# Patient Record
Sex: Female | Born: 1965
Health system: Southern US, Community
[De-identification: ages and names within clinical notes are randomized; demographics above are authoritative.]

## PROBLEM LIST (undated history)

## (undated) HISTORY — PX: ECTOPIC PREGNANCY SURGERY: SHX613

---

## 1999-02-28 ENCOUNTER — Ambulatory Visit (HOSPITAL_COMMUNITY): Admission: RE | Admit: 1999-02-28 | Discharge: 1999-02-28 | Payer: Self-pay | Admitting: Gastroenterology

## 2006-05-21 ENCOUNTER — Other Ambulatory Visit: Admission: RE | Admit: 2006-05-21 | Discharge: 2006-05-21 | Payer: Self-pay | Admitting: Gynecology

## 2006-07-04 ENCOUNTER — Ambulatory Visit (HOSPITAL_COMMUNITY): Admission: RE | Admit: 2006-07-04 | Discharge: 2006-07-04 | Payer: Self-pay | Admitting: Gynecology

## 2006-09-19 ENCOUNTER — Encounter: Admission: RE | Admit: 2006-09-19 | Discharge: 2006-09-19 | Payer: Self-pay | Admitting: Gynecology

## 2010-03-04 ENCOUNTER — Encounter: Payer: Self-pay | Admitting: Gynecology

## 2013-03-27 ENCOUNTER — Encounter (HOSPITAL_BASED_OUTPATIENT_CLINIC_OR_DEPARTMENT_OTHER): Payer: Self-pay | Admitting: Emergency Medicine

## 2013-03-27 ENCOUNTER — Emergency Department (HOSPITAL_BASED_OUTPATIENT_CLINIC_OR_DEPARTMENT_OTHER)
Admission: EM | Admit: 2013-03-27 | Discharge: 2013-03-27 | Disposition: A | Discharge status: Home / Self Care | Payer: Self-pay | Attending: Emergency Medicine | Admitting: Emergency Medicine

## 2013-03-27 ENCOUNTER — Emergency Department (HOSPITAL_BASED_OUTPATIENT_CLINIC_OR_DEPARTMENT_OTHER): Payer: Self-pay

## 2013-03-27 DIAGNOSIS — M6530 Trigger finger, unspecified finger: Secondary | ICD-10-CM | POA: Insufficient documentation

## 2013-03-27 MED ORDER — NAPROXEN 500 MG PO TABS: 500.0000 mg | ORAL_TABLET | Freq: Two times a day (BID) | ORAL | Status: DC

## 2013-03-27 NOTE — ED Provider Notes (Signed)
CSN: [id number]     Arrival date & time 03/27/13  1228 History   First MD Initiated Contact with Patient 03/27/13 1240     Chief Complaint  Patient presents with  . Hand Pain     (Consider location/radiation/quality/duration/timing/severity/associated sxs/prior Treatment) Patient is a 48 y.o. female presenting with hand pain. The history is provided by the patient.  Hand Pain This is a new problem. The problem occurs constantly. The problem has been gradually worsening.   Shannon Mccormick is a 48 y.o. female who presents to the ED with right thumb pain. She has had the pain for several days. She states that she woke one morning and felt a pop in her thumb and it has been giving her problems since then. She denies any other problems.   History reviewed. No pertinent past medical history. History reviewed. No pertinent past surgical history. No family history on file. History  Substance Use Topics  . Smoking status: Never Smoker   . Smokeless tobacco: Not on file  . Alcohol Use: Yes   OB History   Grav Para Term Preterm Abortions TAB SAB Ect Mult Living                 Review of Systems Negative except as stated in HPI   Allergies  Codeine  Home Medications  No current outpatient prescriptions on file. BP 133/79  Pulse 98  Temp(Src) 98 F (36.7 C) (Oral)  Resp 18  Ht 5\' 7"  (1.702 m)  Wt 158 lb (71.668 kg)  BMI 24.74 kg/m2  SpO2 100% Physical Exam  Nursing note and vitals reviewed. Constitutional: She is oriented to person, place, and time. She appears well-developed and well-nourished.  HENT:  Head: Normocephalic.  Eyes: EOM are normal.  Neck: Neck supple.  Cardiovascular: Normal rate.   Pulmonary/Chest: Effort normal.  Musculoskeletal:       Right hand: She exhibits normal range of motion, normal capillary refill, no deformity, no laceration and no swelling. Tenderness: minimal. Normal sensation noted. Normal strength noted.       Hands: Radial pulses equal,  adequate circulation, good touch sensation.   Neurological: She is alert and oriented to person, place, and time. No cranial nerve deficit.  Skin: Skin is warm and dry.  Psychiatric: She has a normal mood and affect.    ED Course  Procedures   MDM  48 y.o. female with right thumb popping. Will treat for trigger finger. Placed in splint and she will follow up with ortho. She will return here as needed for problems.  Discussed with the patient and all questioned fully answered.   Medication List         naproxen 500 MG tablet  Commonly known as:  NAPROSYN  Take 1 tablet (500 mg total) by mouth 2 (two) times daily.            [address]Hope [place], [place] 03/29/13 [phone]

## 2013-03-27 NOTE — ED Notes (Signed)
Patient c/o R thumb pain, moves in jerking motion with associated pain, taking ibuprofen but no relief

## 2013-03-27 NOTE — Discharge Instructions (Signed)
Happy Valentines day. We are applying a splint to your thumb to try and help with the pain. We are prescribing medication for pain and inflammation. Follow up with Dr. Pearletha ForgeHudnall. Return here as needed for problems.   Trigger Finger Trigger finger (digital tendinitis and stenosing tenosynovitis) is a common disorder that causes an often painful catching of the fingers or thumb. It occurs as a clicking, snapping, or locking of a finger in the palm of the hand. This is caused by a problem with the tendons that flex or bend the fingers sliding smoothly through their sheaths. The condition may occur in any finger or a couple fingers at the same time.  The finger may lock with the finger curled or suddenly straighten out with a snap. This is more common in patients with rheumatoid arthritis and diabetes. Left untreated, the condition may get worse to the point where the finger becomes locked in flexion, like making a fist, or less commonly locked with the finger straightened out. CAUSES   Inflammation and scarring that lead to swelling around the tendon sheath.  Repeated or forceful movements.  Rheumatoid arthritis, an autoimmune disease that affects joints.  Gout.  Diabetes mellitus. SIGNS AND SYMPTOMS  Soreness and swelling of your finger.  A painful clicking or snapping as you bend and straighten your finger. DIAGNOSIS  Your health care provider will do a physical exam of your finger to diagnose trigger finger. TREATMENT   Splinting for 6 8 weeks may be helpful.  Nonsteroidal anti-inflammatory medicines (NSAIDs) can help to relieve the pain and inflammation.  Cortisone injections, along with splinting, may speed up recovery. Several injections may be required. Cortisone may give relief after one injection.  Surgery is another treatment that may be used if conservative treatments do not work. Surgery can be minor, without incisions (a cut does not have to be made), and can be done with a  needle through the skin.  Other surgical choices involve an open procedure in which the surgeon opens the hand through a small incision and cuts the pulley so the tendon can again slide smoothly. Your hand will still work fine. HOME CARE INSTRUCTIONS  Apply ice to the injured area, twice per day:  Put ice in a plastic bag.  Place a towel between your skin and the bag.  Leave the ice on for 20 minutes, 3 4 times a day.  Rest your hand often. MAKE SURE YOU:   Understand these instructions.  Will watch your condition.  Will get help right away if you are not doing well or get worse. Document Released: 11/18/2003 Document Revised: 09/30/2012 Document Reviewed: 06/30/2012 [facility] Patient Information 2014 [place], [place].

## 2013-03-29 NOTE — ED Provider Notes (Signed)
Medical screening examination/treatment/procedure(s) were performed by non-physician practitioner and as supervising physician I was immediately available for consultation/collaboration.  EKG Interpretation   None        Ethelda ChickMartha K Linker, MD 03/29/13 [phone]

## 2014-07-30 ENCOUNTER — Encounter (HOSPITAL_BASED_OUTPATIENT_CLINIC_OR_DEPARTMENT_OTHER): Payer: Self-pay | Admitting: *Deleted

## 2014-07-30 ENCOUNTER — Emergency Department (HOSPITAL_BASED_OUTPATIENT_CLINIC_OR_DEPARTMENT_OTHER)
Admission: EM | Admit: 2014-07-30 | Discharge: 2014-07-30 | Disposition: A | Discharge status: Home / Self Care | Payer: Self-pay | Attending: Emergency Medicine | Admitting: Emergency Medicine

## 2014-07-30 DIAGNOSIS — Z791 Long term (current) use of non-steroidal anti-inflammatories (NSAID): Secondary | ICD-10-CM | POA: Insufficient documentation

## 2014-07-30 DIAGNOSIS — L237 Allergic contact dermatitis due to plants, except food: Secondary | ICD-10-CM | POA: Insufficient documentation

## 2014-07-30 MED ORDER — PREDNISONE 10 MG PO TABS: ORAL_TABLET | ORAL | Status: DC

## 2014-07-30 NOTE — Discharge Instructions (Signed)
Poison Ivy  Poison ivy is a rash caused by touching the leaves of the poison ivy plant. The rash often shows up 48 hours later. You might just have bumps, redness, and itching. Sometimes, blisters appear and break open. Your eyes may get puffy (swollen). Poison ivy often heals in 2 to 3 weeks without treatment.  HOME CARE  · If you touch poison ivy:  ¨ Wash your skin with soap and water right away. Wash under your fingernails. Do not rub the skin very hard.  ¨ Wash any clothes you were wearing.  · Avoid poison ivy in the future. Poison ivy has 3 leaves on a stem.  · Use medicine to help with itching as told by your doctor. Do not drive when you take this medicine.  · Keep open sores dry, clean, and covered with a bandage and medicated cream, if needed.  · Ask your doctor about medicine for children.  GET HELP RIGHT AWAY IF:  · You have open sores.  · Redness spreads beyond the area of the rash.  · There is yellowish white fluid (pus) coming from the rash.  · Pain gets worse.  · You have a temperature by mouth above 102° F (38.9° C), not controlled by medicine.  MAKE SURE YOU:  · Understand these instructions.  · Will watch your condition.  · Will get help right away if you are not doing well or get worse.  Document Released: 03/02/2010 Document Revised: 04/22/2011 Document Reviewed: 03/02/2010  ExitCare® Patient Information ©2015 [employer] LLC. This information is not intended to replace advice given to you by your health care provider. Make sure you discuss any questions you have with your health care provider.

## 2014-07-30 NOTE — ED Notes (Signed)
Pt reports rash to legs and arms since cutting grass on Sunday- itchy-

## 2014-07-30 NOTE — ED Provider Notes (Signed)
CSN: [id number]     Arrival date & time 07/30/14  1708 History   First MD Initiated Contact with Patient 07/30/14 1725     Chief Complaint  Patient presents with  . Rash     (Consider location/radiation/quality/duration/timing/severity/associated sxs/prior Treatment) HPI Comments: Pt comes in with rash to legs and arms that started about 1 week ago. Pt states that the rash is very itchy. No fever or tick exposure. She has not tried any thing for the symptoms. Pt states that she was mowing the grass the day before this happened. Denies problems swallowing or breathing  The history is provided by the patient. No language interpreter was used.    History reviewed. No pertinent past medical history. Past Surgical History  Procedure Laterality Date  . Cesarean section    . Ectopic pregnancy surgery     No family history on file. History  Substance Use Topics  . Smoking status: Never Smoker   . Smokeless tobacco: Never Used  . Alcohol Use: Yes   OB History    No data available     Review of Systems  All other systems reviewed and are negative.     Allergies  Codeine  Home Medications   Prior to Admission medications   Medication Sig Start Date End Date Taking? Authorizing Provider  naproxen (NAPROSYN) 500 MG tablet Take 1 tablet (500 mg total) by mouth 2 (two) times daily. 03/27/13   Hope Orlene Och, NP  predniSONE (DELTASONE) 10 MG tablet 6 day step down dose 07/30/14   Teressa Lower, NP   BP 119/69 mmHg  Pulse 89  Temp(Src) 98.4 F (36.9 C) (Oral)  Resp 18  Ht 5\' 7"  (1.702 m)  SpO2 100% Physical Exam  Constitutional: She is oriented to person, place, and time. She appears well-developed and well-nourished.  HENT:  Head: Normocephalic and atraumatic.  Right Ear: External ear normal.  Left Ear: External ear normal.  Mouth/Throat: Oropharynx is clear and moist.  Cardiovascular: Normal rate and regular rhythm.   Pulmonary/Chest: Effort normal and breath sounds  normal.  Musculoskeletal: Normal range of motion.  Neurological: She is alert and oriented to person, place, and time.  Skin:  Linear vesicular rash to all extremities noted  Nursing note and vitals reviewed.   ED Course  Procedures (including critical care time) Labs Review Labs Reviewed - No data to display  Imaging Review No results found.   EKG Interpretation None      MDM   Final diagnoses:  Poison ivy    Rash consistent with poison ivy. Will do a prednisone dose back.   Teressa Lower, NP 07/30/14 1740  Glynn Octave, MD 07/30/14 Windy Fast

## 2015-05-21 IMAGING — CR DG FINGER THUMB 2+V*R*
3 series · 3 of 3 positions shown · non-contrast
Comparison: None.

CLINICAL DATA: Right thumb pain for 3 months

EXAM:
RIGHT THUMB 2+V

[x finger pa right]
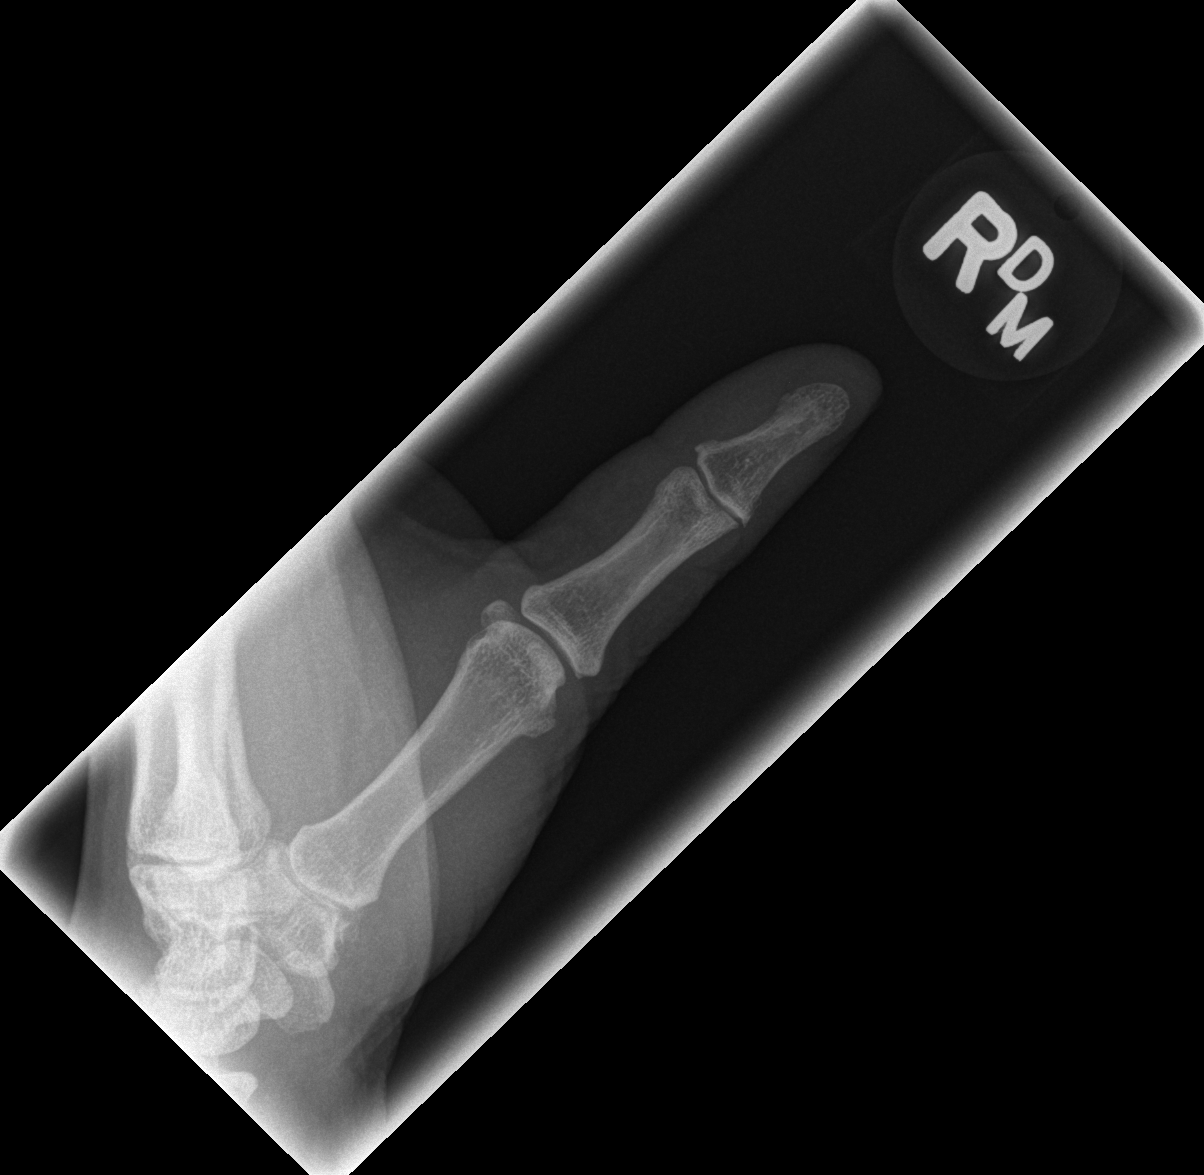

[x finger obl. right]
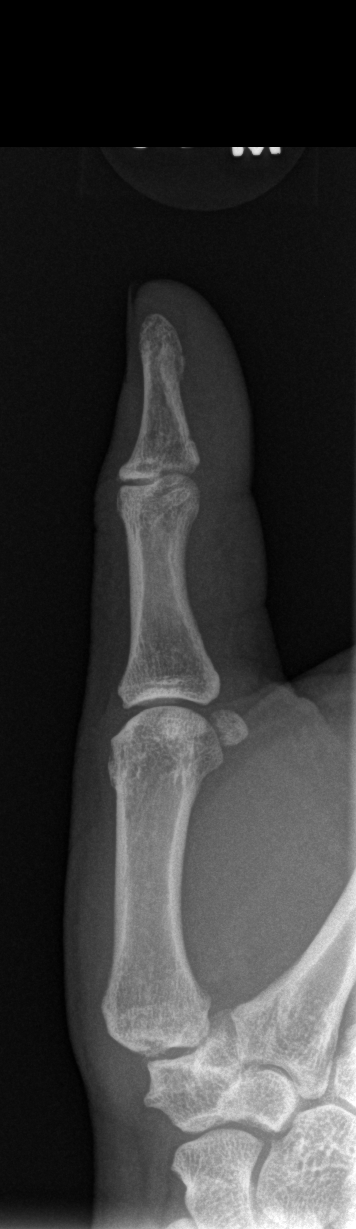

[x finger lateral right]
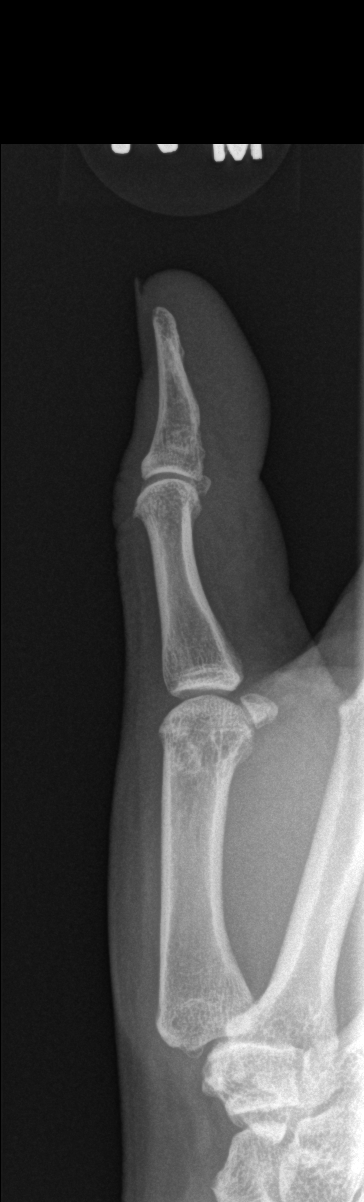

[3 of 3 positions shown; findings below may reference images not displayed]

FINDINGS: There is no evidence of fracture or dislocation. There is no
evidence of arthropathy or other focal bone abnormality. Soft
tissues are unremarkable. Minimal interphalangeal joint degenerative
change with spurring noted.
IMPRESSION: Negative.

## 2016-04-30 ENCOUNTER — Emergency Department (HOSPITAL_BASED_OUTPATIENT_CLINIC_OR_DEPARTMENT_OTHER)
Admission: EM | Admit: 2016-04-30 | Discharge: 2016-04-30 | Disposition: A | Discharge status: Home / Self Care | Payer: BLUE CROSS/BLUE SHIELD | Attending: Emergency Medicine | Admitting: Emergency Medicine

## 2016-04-30 ENCOUNTER — Encounter (HOSPITAL_BASED_OUTPATIENT_CLINIC_OR_DEPARTMENT_OTHER): Payer: Self-pay

## 2016-04-30 ENCOUNTER — Emergency Department (HOSPITAL_BASED_OUTPATIENT_CLINIC_OR_DEPARTMENT_OTHER): Payer: BLUE CROSS/BLUE SHIELD

## 2016-04-30 DIAGNOSIS — R0602 Shortness of breath: Secondary | ICD-10-CM | POA: Diagnosis not present

## 2016-04-30 DIAGNOSIS — R0789 Other chest pain: Secondary | ICD-10-CM | POA: Insufficient documentation

## 2016-04-30 DIAGNOSIS — R079 Chest pain, unspecified: Secondary | ICD-10-CM | POA: Diagnosis present

## 2016-04-30 DIAGNOSIS — Z87891 Personal history of nicotine dependence: Secondary | ICD-10-CM | POA: Diagnosis not present

## 2016-04-30 DIAGNOSIS — R1012 Left upper quadrant pain: Secondary | ICD-10-CM | POA: Insufficient documentation

## 2016-04-30 LAB — CBC WITH DIFFERENTIAL/PLATELET
BASOS ABS: 0 10*3/uL (ref 0.0–0.1)
BASOS PCT: 0 %
EOS ABS: 0.3 10*3/uL (ref 0.0–0.7)
Eosinophils Relative: 4 %
HEMATOCRIT: 39.3 % (ref 36.0–46.0)
Hemoglobin: 13.7 g/dL (ref 12.0–15.0)
Lymphocytes Relative: 50 %
Lymphs Abs: 3.5 10*3/uL (ref 0.7–4.0)
MCH: 29 pg (ref 26.0–34.0)
MCHC: 34.9 g/dL (ref 30.0–36.0)
MCV: 83.3 fL (ref 78.0–100.0)
MONO ABS: 0.7 10*3/uL (ref 0.1–1.0)
Monocytes Relative: 10 %
NEUTROS ABS: 2.6 10*3/uL (ref 1.7–7.7)
Neutrophils Relative %: 36 %
Platelets: 273 10*3/uL (ref 150–400)
RBC: 4.72 MIL/uL (ref 3.87–5.11)
RDW: 13.3 % (ref 11.5–15.5)
WBC: 7 10*3/uL (ref 4.0–10.5)

## 2016-04-30 LAB — TROPONIN I: Troponin I: <0.03 ng/mL (ref <0.03)

## 2016-04-30 LAB — COMPREHENSIVE METABOLIC PANEL
ALBUMIN: 4.6 g/dL (ref 3.5–5.0)
ALT: 21 U/L (ref 14–54)
AST: 23 U/L (ref 15–41)
Alkaline Phosphatase: 66 U/L (ref 38–126)
Anion gap: 7 (ref 5–15)
BILIRUBIN TOTAL: 0.7 mg/dL (ref 0.3–1.2)
BUN: 13 mg/dL (ref 6–20)
CHLORIDE: 104 mmol/L (ref 101–111)
CO2: 27 mmol/L (ref 22–32)
Calcium: 9.4 mg/dL (ref 8.9–10.3)
Creatinine, Ser: 1.15 mg/dL — ABNORMAL HIGH (ref 0.44–1.00)
GFR calc Af Amer: >60 mL/min (ref >60)
GFR calc non Af Amer: 53 mL/min — ABNORMAL LOW (ref >60)
GLUCOSE: 103 mg/dL — AB (ref 65–99)
POTASSIUM: 3.7 mmol/L (ref 3.5–5.1)
SODIUM: 138 mmol/L (ref 135–145)
TOTAL PROTEIN: 7.9 g/dL (ref 6.5–8.1)

## 2016-04-30 LAB — LIPASE, BLOOD: Lipase: 29 U/L (ref 11–51)

## 2016-04-30 MED ORDER — NAPROXEN 500 MG PO TABS: 500.0000 mg | ORAL_TABLET | Freq: Three times a day (TID) | ORAL | 0 refills | Status: DC | PRN

## 2016-04-30 MED ORDER — IBUPROFEN 400 MG PO TABS
600.0000 mg | ORAL_TABLET | Freq: Once | ORAL | Status: AC
  Administered 2016-04-30: 600 mg via ORAL
  Filled 2016-04-30: qty 1

## 2016-04-30 NOTE — ED Triage Notes (Signed)
c/o LUQ pain x "months"-NAD-steady gait

## 2016-04-30 NOTE — ED Provider Notes (Signed)
[facility] Provider Note   CSN: [id number] Arrival date & time: 04/30/16  1728   By signing my name below, I, Clarisse Gouge, attest that this documentation has been prepared under the direction and in the presence of Lavera Guise, MD. Electronically signed, Clarisse Gouge, ED Scribe. 04/30/16. 6:48 PM.   History   Chief Complaint Chief Complaint  Patient presents with  . Abdominal Pain   The history is provided by the patient and medical records. No language interpreter was used.    HPI Comments: Shannon Mccormick is a 51 y.o. female who presents to the Emergency Department complaining of pain under the left breast 6-8 months. She notes "come and go", pressure-like, "knot"-like, cramping pain that radiates downward, and she states it is exacerbated and constant with sitting down. She adds her pain is improved with walking around.She notes no attempted treatments at home. Pt denies N/V/D, bloody or dark stools, dysuria, fever, frequency or urgency. No dyspnea, palpation, syncope or near syncope. Only has pain when sitting down or with movement.   History reviewed. No pertinent past medical history.  There are no active problems to display for this patient.   Past Surgical History:  Procedure Laterality Date  . CESAREAN SECTION    . ECTOPIC PREGNANCY SURGERY      OB History    No data available       Home Medications    Prior to Admission medications   Medication Sig Start Date End Date Taking? Authorizing Provider  naproxen (NAPROSYN) 500 MG tablet Take 1 tablet (500 mg total) by mouth 3 (three) times daily with meals as needed for mild pain or moderate pain. 04/30/16   Lavera Guise, MD    Family History No family history on file.  Social History Social History  Substance Use Topics  . Smoking status: Former Games developer  . Smokeless tobacco: Never Used  . Alcohol use Yes     Comment: occ     Allergies   Codeine   Review of Systems Review of Systems    Constitutional: Negative for fever.  HENT: Negative for congestion.   Respiratory: Positive for shortness of breath.   Cardiovascular: Negative for leg swelling.  Gastrointestinal: Negative for blood in stool, diarrhea, nausea and vomiting.  Genitourinary: Negative for dysuria, frequency, hematuria and urgency.  Musculoskeletal: Positive for arthralgias and myalgias. Negative for joint swelling.  Allergic/Immunologic: Negative for immunocompromised state.  Neurological: Negative for syncope.  Hematological: Does not bruise/bleed easily.  Psychiatric/Behavioral: Negative for confusion.  All other systems reviewed and are negative.    Physical Exam Updated Vital Signs BP 131/87 (BP Location: Right Arm)   Pulse 73   Temp 98.3 F (36.8 C) (Oral)   Resp 17   SpO2 100%   Physical Exam Physical Exam  Nursing note and vitals reviewed. Constitutional: Well developed, well nourished, non-toxic, and in no acute distress Head: Normocephalic and atraumatic.  Mouth/Throat: Oropharynx is clear and moist.  Neck: Normal range of motion. Neck supple.  Cardiovascular: Normal rate and regular rhythm.   Pulmonary/Chest: Effort normal and breath sounds normal. Reproducible TTP over left 12th rib. Abdominal: Soft. There is no tenderness. There is no rebound and no guarding.  Musculoskeletal: Normal range of motion.  Neurological: Alert, no facial droop, fluent speech, moves all extremities symmetrically Skin: Skin is warm and dry.  Psychiatric: Cooperative   ED Treatments / Results  DIAGNOSTIC STUDIES: Oxygen Saturation is 100% on room air, normal by my interpretation.  COORDINATION OF CARE: 6:45 PM Discussed treatment plan with pt at bedside and pt agreed to plan. Will order blood work, medications and imaging.  Labs (all labs ordered are listed, but only abnormal results are displayed) Labs Reviewed  COMPREHENSIVE METABOLIC PANEL - Abnormal; Notable for the following:       Result  Value   Glucose, Bld 103 (*)    Creatinine, Ser 1.15 (*)    GFR calc non Af Amer 53 (*)    All other components within normal limits  CBC WITH DIFFERENTIAL/PLATELET  LIPASE, BLOOD  TROPONIN I    EKG  EKG Interpretation  Date/Time:  Tuesday April 30 2016 19:32:02 EDT Ventricular Rate:  75 PR Interval:    QRS Duration: 70 QT Interval:  405 QTC Calculation: 453 R Axis:   37 Text Interpretation:  Sinus rhythm Consider left atrial enlargement  normal early repol pattern no prior EKG  Confirmed by Kaylob Wallen MD, [place] Hippert [phone]) on 04/30/2016 8:00:46 PM       Radiology Dg Chest 2 View  Result Date: 04/30/2016 CLINICAL DATA:  Left lower anterior chest pain EXAM: CHEST  2 VIEW COMPARISON:  None. FINDINGS: The heart size and mediastinal contours are within normal limits. Both lungs are clear. The visualized skeletal structures are unremarkable. IMPRESSION: No active cardiopulmonary disease. Electronically Signed   By: Jasmine PangKim  Fujinaga M.D.   On: 04/30/2016 19:08    Procedures Procedures (including critical care time)  Medications Ordered in ED Medications  ibuprofen (ADVIL,MOTRIN) tablet 600 mg (600 mg Oral Given 04/30/16 1927)     Initial Impression / Assessment and Plan / ED Course  I have reviewed the triage vital signs and the nursing notes.  Pertinent labs & imaging results that were available during my care of the patient were reviewed by me and considered in my medical decision making (see chart for details).     51 year old female who presents with left upper quadrant abdominal pain. However on exam, she is not tender with palpation of her abdomen, and pain is reproducible with palpation of the left lower 11th and 12th ribs. It seems like musculoskeletal chest pain rather than true abdominal pain. Also he only feels this pain with sitting down and movement, suggestive of musculoskeletal pain. 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 Low  suspicion for ACS or other serious intrathoracic or cardiopulmonary cause of symptoms. EKG is nonischemic. Chest x-ray visualized and does not show acute cardiopulmonary processes. Blood work is all reassuring. I discussed supportive care as well as over-the-counter analgesics.  Final Clinical Impressions(s) / ED Diagnoses   Final diagnoses:  Chest wall pain    New Prescriptions New Prescriptions   NAPROXEN (NAPROSYN) 500 MG TABLET    Take 1 tablet (500 mg total) by mouth 3 (three) times daily with meals as needed for mild pain or moderate pain.   I personally performed the services described in this documentation, which was scribed in my presence. The recorded information has been reviewed and is accurate.    Lavera Guiseana Duo Buddy Loeffelholz, MD 04/30/16 2013

## 2016-04-30 NOTE — Discharge Instructions (Signed)
Your heart work-up and blood work is reassuring. Your chest x-ray also looks normal.  Take naprosyn for pain. Use ice or heat as adjunct. Avoid heavy lifting or strenuous activity at work.  Return for worsening symptoms, including fever, worsening pain, difficulty breathing, passing out or any there symptoms concerning to you.

## 2017-07-31 ENCOUNTER — Emergency Department (HOSPITAL_BASED_OUTPATIENT_CLINIC_OR_DEPARTMENT_OTHER): Payer: No Typology Code available for payment source

## 2017-07-31 ENCOUNTER — Other Ambulatory Visit: Payer: Self-pay

## 2017-07-31 ENCOUNTER — Encounter (HOSPITAL_BASED_OUTPATIENT_CLINIC_OR_DEPARTMENT_OTHER): Payer: Self-pay | Admitting: Emergency Medicine

## 2017-07-31 ENCOUNTER — Emergency Department (HOSPITAL_BASED_OUTPATIENT_CLINIC_OR_DEPARTMENT_OTHER)
Admission: EM | Admit: 2017-07-31 | Discharge: 2017-07-31 | Disposition: A | Discharge status: Home / Self Care | Payer: No Typology Code available for payment source | Attending: Emergency Medicine | Admitting: Emergency Medicine

## 2017-07-31 DIAGNOSIS — Y990 Civilian activity done for income or pay: Secondary | ICD-10-CM | POA: Insufficient documentation

## 2017-07-31 DIAGNOSIS — T148XXA Other injury of unspecified body region, initial encounter: Secondary | ICD-10-CM | POA: Diagnosis not present

## 2017-07-31 DIAGNOSIS — Y9389 Activity, other specified: Secondary | ICD-10-CM | POA: Diagnosis not present

## 2017-07-31 DIAGNOSIS — X58XXXA Exposure to other specified factors, initial encounter: Secondary | ICD-10-CM | POA: Insufficient documentation

## 2017-07-31 DIAGNOSIS — M79604 Pain in right leg: Secondary | ICD-10-CM | POA: Insufficient documentation

## 2017-07-31 DIAGNOSIS — Y92129 Unspecified place in nursing home as the place of occurrence of the external cause: Secondary | ICD-10-CM | POA: Insufficient documentation

## 2017-07-31 DIAGNOSIS — Z87891 Personal history of nicotine dependence: Secondary | ICD-10-CM | POA: Diagnosis not present

## 2017-07-31 MED ORDER — DIAZEPAM 5 MG PO TABS: 2.5000 mg | ORAL_TABLET | Freq: Four times a day (QID) | ORAL | 0 refills | Status: AC | PRN

## 2017-07-31 MED ORDER — KETOROLAC TROMETHAMINE 60 MG/2ML IM SOLN
60.0000 mg | Freq: Once | INTRAMUSCULAR | Status: AC
  Administered 2017-07-31: 60 mg via INTRAMUSCULAR
  Filled 2017-07-31: qty 2

## 2017-07-31 MED ORDER — OXYCODONE-ACETAMINOPHEN 5-325 MG PO TABS
1.0000 | ORAL_TABLET | Freq: Once | ORAL | Status: AC
  Administered 2017-07-31: 1 via ORAL
  Filled 2017-07-31: qty 1

## 2017-07-31 MED ORDER — DIAZEPAM 2 MG PO TABS
2.0000 mg | ORAL_TABLET | Freq: Once | ORAL | Status: AC
  Administered 2017-07-31: 2 mg via ORAL
  Filled 2017-07-31: qty 1

## 2017-07-31 MED ORDER — MELOXICAM 7.5 MG PO TABS: 7.5000 mg | ORAL_TABLET | Freq: Every day | ORAL | 0 refills | Status: AC

## 2017-07-31 MED ORDER — OXYCODONE-ACETAMINOPHEN 5-325 MG PO TABS: 1.0000 | ORAL_TABLET | Freq: Three times a day (TID) | ORAL | 0 refills | Status: AC | PRN

## 2017-07-31 MED ORDER — ONDANSETRON 8 MG PO TBDP
8.0000 mg | ORAL_TABLET | Freq: Once | ORAL | Status: AC
  Administered 2017-07-31: 8 mg via ORAL
  Filled 2017-07-31: qty 1

## 2017-07-31 MED FILL — OXYCODONE-ACETAMINOPHEN 5-3: 5-325 | 3 days supply | Qty: 15 | Fill #0

## 2017-07-31 MED FILL — MELOXICAM 7.5 MG TABLET: 7.5 | 30 days supply | Qty: 30 | Fill #0

## 2017-07-31 MED FILL — diazePAM 5 MG TABS: 5 | 2 days supply | Qty: 10 | Fill #0

## 2017-07-31 NOTE — ED Notes (Signed)
ED Provider at bedside. 

## 2017-07-31 NOTE — ED Provider Notes (Signed)
Emergency Department Provider Note   I have reviewed the triage vital signs and the nursing notes.   HISTORY  Chief Complaint Leg Pain   HPI Shannon Mccormick is a 52 y.o. 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  Patient states that patient she was helping probably weighs 300 pounds in the assisted living facility.  The patient was getting ready to fall so she put her right leg in between the patient's legs and was holding her up and another person came to grab underneath the other patient's shoulder Ms. Keys was going to grab underneath the patient's other shoulder and the patient full weight went down on her right leg.  She instantly has some pain in her groin area but that resolved quickly.  They are able to get the patient to a safe spot in the Ms. Coachman initially had a little bit of pain in her buttock.  She sat down to roll silverware when she went to stand up a little while later she had severe pain radiating down the whole back of her leg from her buttock.  Had difficulty walking secondary to the pain and difficulty with moving her right leg.  After a while trying to walk she could not do very well and she was relying on her left side her left side started to hurt as well.  No other falls no other injuries.  She describes the pain is severe sharp. No other associated or modifying symptoms.    History reviewed. No pertinent past medical history.  There are no active problems to display for this patient.   Past Surgical History:  Procedure Laterality Date  . CESAREAN SECTION    . ECTOPIC PREGNANCY SURGERY      Current Outpatient Rx  . Order #: [id number] Class: Print  . Order #: [id number] Class: Print  . Order #: [id number] Class: Print    Allergies Codeine  No family history on file.  Social History Social History   Tobacco Use  . Smoking status:  Former Games developer  . Smokeless tobacco: Never Used  Substance Use Topics  . Alcohol use: Yes    Comment: occ  . Drug use: No    Review of Systems  All other systems negative except as documented in the HPI. All pertinent positives and negatives as reviewed in the HPI. ____________________________________________   PHYSICAL EXAM:  VITAL SIGNS: ED Triage Vitals  Enc Vitals Group     BP 07/31/17 0908 (!) 146/91     Pulse Rate 07/31/17 0908 91     Resp 07/31/17 0908 18     Temp 07/31/17 0908 98.4 F (36.9 C)     Temp Source 07/31/17 0908 Oral     SpO2 07/31/17 0908 100 %     Weight 07/31/17 0916 181 lb (82.1 kg)     Height 07/31/17 0916 5\' 7"  (1.702 m)     Head Circumference --      Peak Flow --      Pain Score 07/31/17 0914 10     Pain Loc --      Pain Edu? --      Excl. in GC? --     Constitutional: Alert and oriented. Well appearing and in no acute distress. Eyes: Conjunctivae are normal. PERRL. EOMI. Head: Atraumatic. Nose: No congestion/rhinnorhea. Mouth/Throat: Mucous membranes are moist.  Oropharynx non-erythematous. Neck: No stridor.  No meningeal signs.  Cardiovascular: Normal rate, regular rhythm. Good peripheral circulation. Grossly normal heart sounds.   Respiratory: Normal respiratory effort.  No retractions. Lungs CTAB. Gastrointestinal: Soft and nontender. No distention.  GU: No obvious prolapse or hernia on pelvic exam. Musculoskeletal: Patient has tenderness from right buttock down to about the lower mid thigh.  No palpable mass.  No deformity.  She also has pain with full extension of her knee and pain with attempted flexion of the knee.  She seems to have some decreased strength by suspect is more related to the pain but difficult to tell. Neurologic:  Normal speech and language. No gross focal neurologic deficits are appreciated.  Skin:  Skin is warm, dry and intact. No rash noted.  ____________________________________________  RADIOLOGY  No results  found.  ____________________________________________   PROCEDURES  Procedure(s) performed:   Procedures   ____________________________________________   INITIAL IMPRESSION / ASSESSMENT AND PLAN / ED COURSE  Suspect hamstring injury. Will xr to ensure no avulsion fractures or other abnormality. otherwise crutches, pain meds and ortho follow up.   Pertinent labs & imaging results that were available during my care of the patient were reviewed by me and considered in my medical decision making (see chart for details).  ____________________________________________  FINAL CLINICAL IMPRESSION(S) / ED DIAGNOSES  Final diagnoses:  Right leg pain  Muscle strain     MEDICATIONS GIVEN DURING THIS VISIT:  Medications  oxyCODONE-acetaminophen (PERCOCET/ROXICET) 5-325 MG per tablet 1 tablet (1 tablet Oral Given 07/31/17 0923)  diazepam (VALIUM) tablet 2 mg (2 mg Oral Given 07/31/17 0923)  ketorolac (TORADOL) injection 60 mg (60 mg Intramuscular Given 07/31/17 0923)  ondansetron (ZOFRAN-ODT) disintegrating tablet 8 mg (8 mg Oral Given 07/31/17 1025)     NEW OUTPATIENT MEDICATIONS STARTED DURING THIS VISIT:  Discharge Medication List as of 07/31/2017 10:36 AM    START taking these medications   Details  diazepam (VALIUM) 5 MG tablet Take 0.5-1 tablets (2.5-5 mg total) by mouth every 6 (six) hours as needed for anxiety (spasms)., Starting Thu 07/31/2017, Print    meloxicam (MOBIC) 7.5 MG tablet Take 1 tablet (7.5 mg total) by mouth daily., Starting Thu 07/31/2017, Print    oxyCODONE-acetaminophen (PERCOCET) 5-325 MG tablet Take 1-2 tablets by mouth every 8 (eight) hours as needed for severe pain., Starting Thu 07/31/2017, Print        Note:  This note was prepared with assistance of Dragon voice recognition software. Occasional wrong-word or sound-a-like substitutions may have occurred due to the inherent limitations of voice recognition software.   Gretna Bergin, Barbara CowerJason, MD 08/02/17  [phone]

## 2017-07-31 NOTE — ED Triage Notes (Signed)
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  Pain started on right side but now both legs are hurting.

## 2017-07-31 NOTE — ED Notes (Signed)
Patient transported to X-ray 

## 2018-03-12 ENCOUNTER — Emergency Department (HOSPITAL_BASED_OUTPATIENT_CLINIC_OR_DEPARTMENT_OTHER)
Admission: EM | Admit: 2018-03-12 | Discharge: 2018-03-12 | Disposition: A | Discharge status: Home / Self Care | Payer: Self-pay | Attending: Emergency Medicine | Admitting: Emergency Medicine

## 2018-03-12 ENCOUNTER — Encounter (HOSPITAL_BASED_OUTPATIENT_CLINIC_OR_DEPARTMENT_OTHER): Payer: Self-pay

## 2018-03-12 DIAGNOSIS — F409 Phobic anxiety disorder, unspecified: Secondary | ICD-10-CM

## 2018-03-12 DIAGNOSIS — F5105 Insomnia due to other mental disorder: Secondary | ICD-10-CM

## 2018-03-12 DIAGNOSIS — Z566 Other physical and mental strain related to work: Secondary | ICD-10-CM | POA: Insufficient documentation

## 2018-03-12 DIAGNOSIS — Z87891 Personal history of nicotine dependence: Secondary | ICD-10-CM | POA: Insufficient documentation

## 2018-03-12 DIAGNOSIS — F419 Anxiety disorder, unspecified: Secondary | ICD-10-CM | POA: Insufficient documentation

## 2018-03-12 DIAGNOSIS — G4700 Insomnia, unspecified: Secondary | ICD-10-CM | POA: Insufficient documentation

## 2018-03-12 MED ORDER — TRAZODONE HCL 50 MG PO TABS
25.0000 mg | ORAL_TABLET | Freq: Every evening | ORAL | 0 refills | Status: AC | PRN
Start: 2018-03-12 — End: ?
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
## 2018-03-12 NOTE — ED Notes (Signed)
NAD at this time. Pt is stable and going home.  

## 2018-03-12 NOTE — Discharge Instructions (Signed)
Contact a health care provider if: Your symptoms get worse. You have new symptoms. You feel overwhelmed by your problems and can no longer manage them on your own. Get help right away if: You have thoughts of hurting yourself or others. If you ever feel like you may hurt yourself or others, or have thoughts about taking your own life, get help right away. You can go to your nearest emergency department or call: Your local emergency services (911 in the [place] A suicide crisis helpline, such as the National Suicide Prevention Lifeline at (732) 145-4209. This is open 24 hours a day.

## 2018-06-24 IMAGING — DX DG CHEST 2V
2 series · 2 of 2 positions shown · non-contrast
Comparison: None.

CLINICAL DATA: Left lower anterior chest pain

EXAM:
CHEST  2 VIEW

[chest pa]
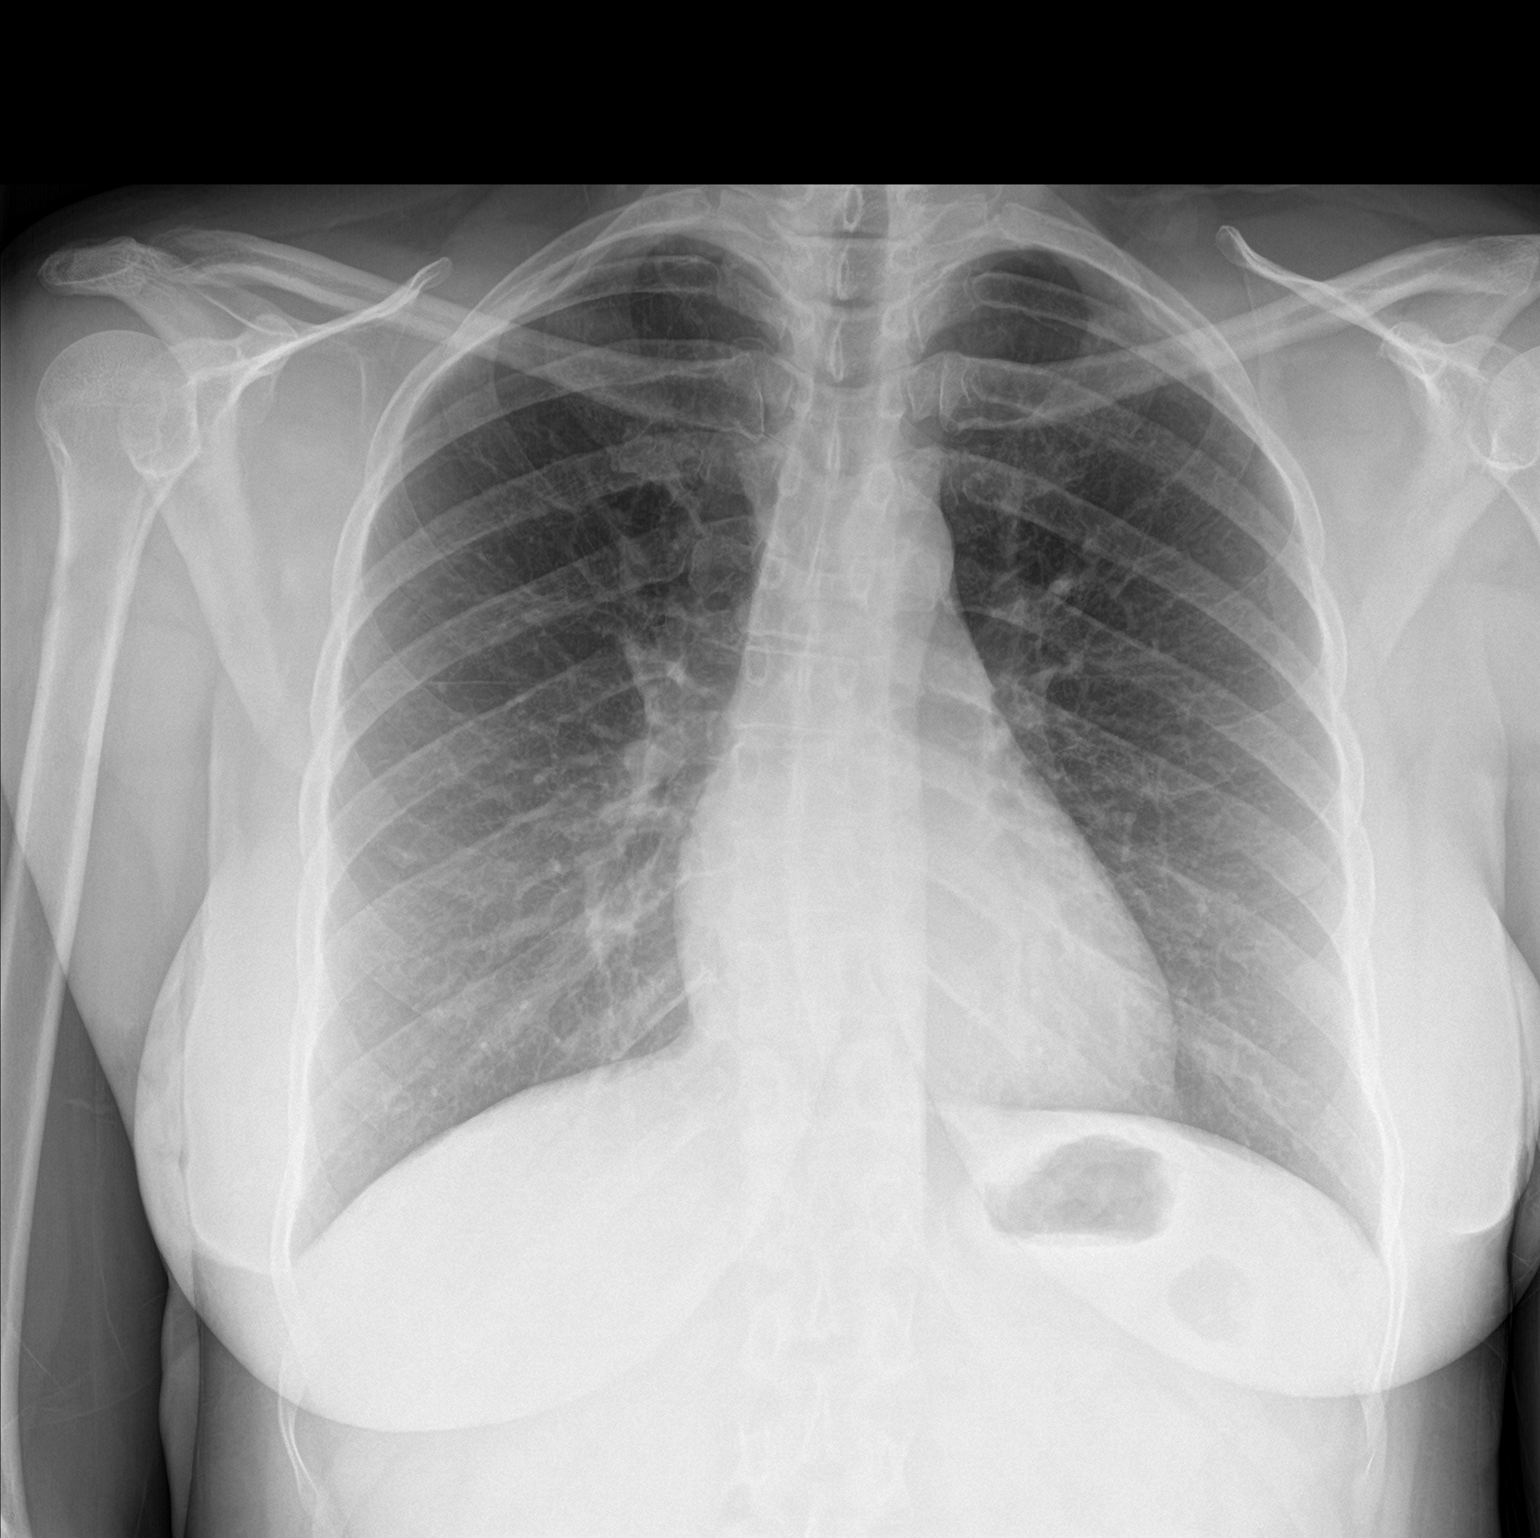

[chest lat]
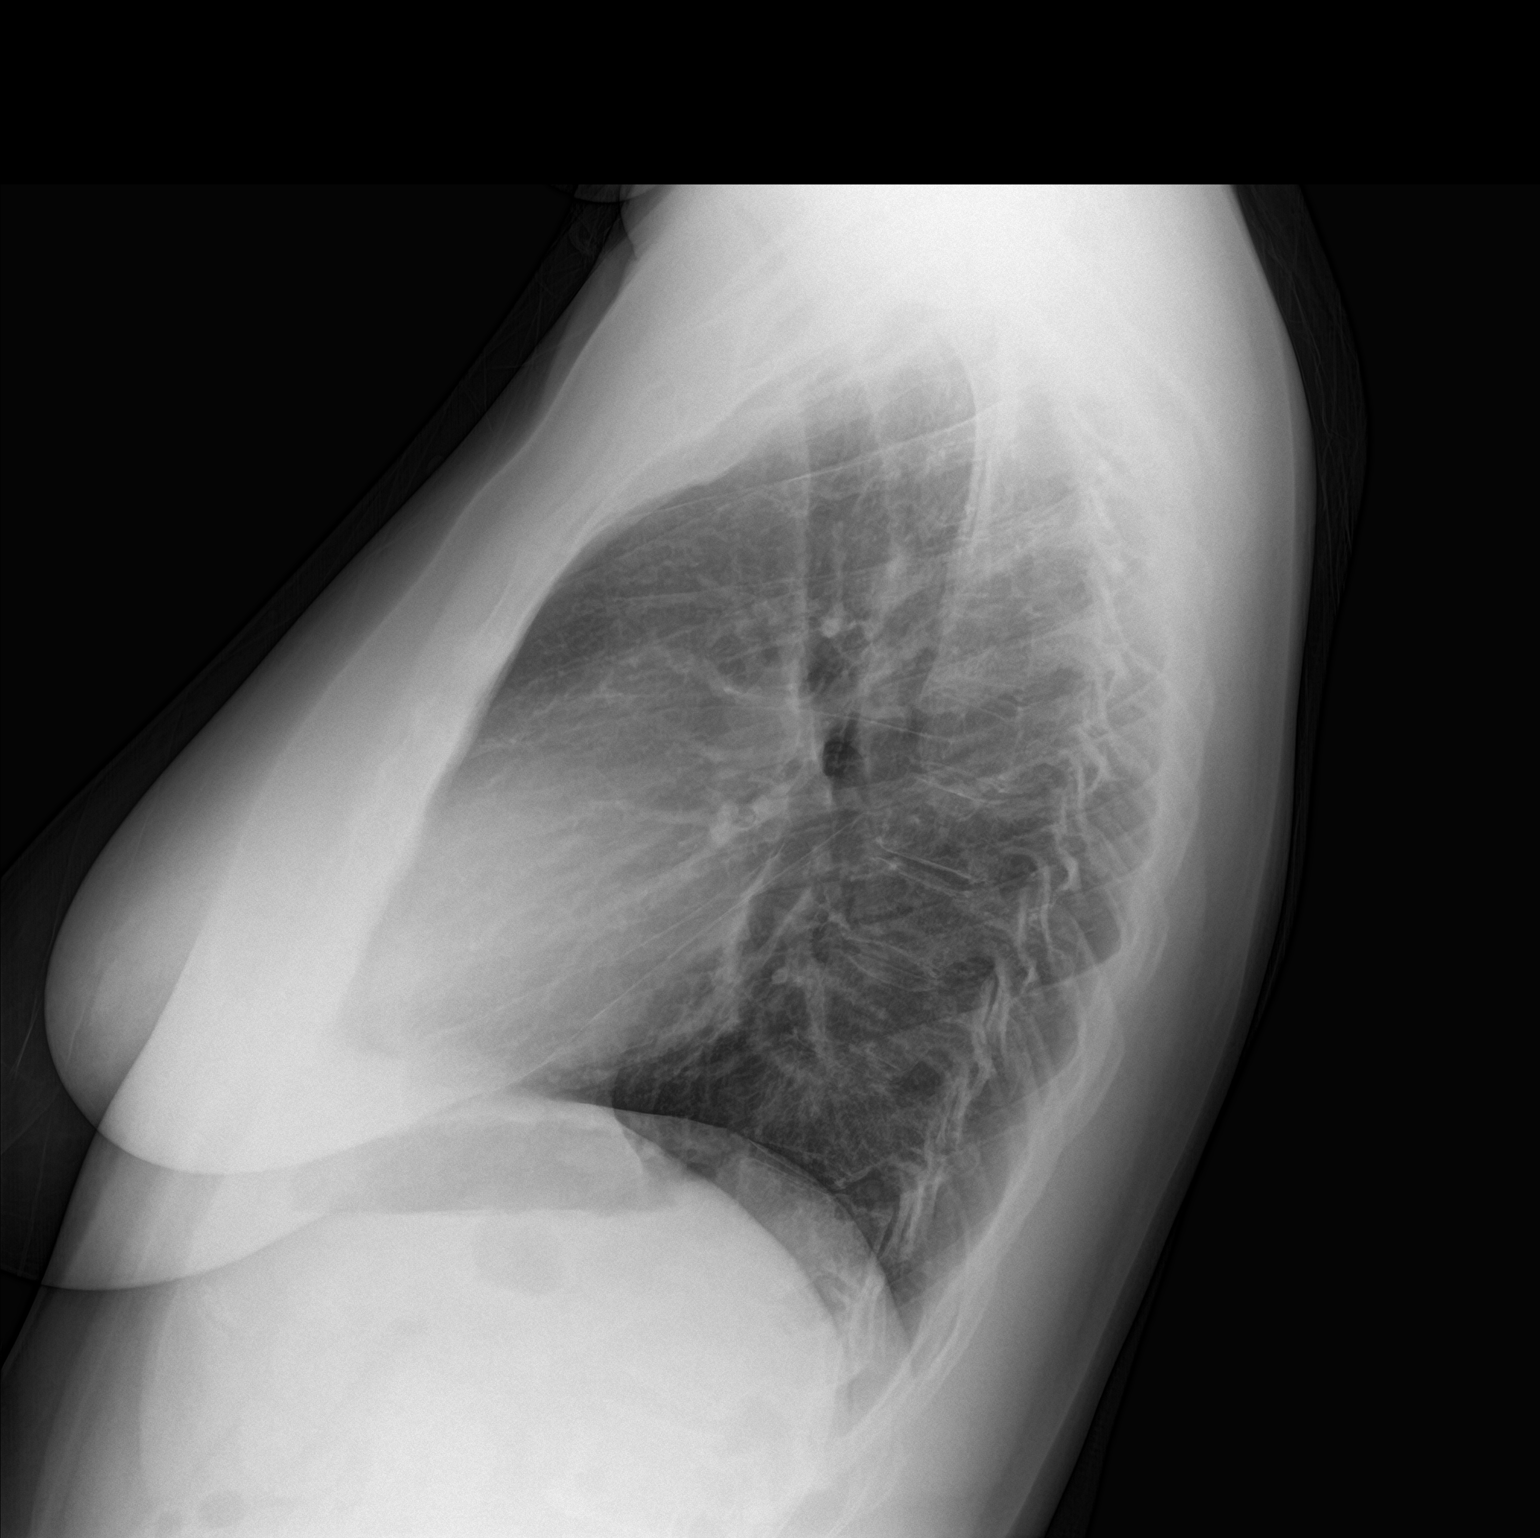

[2 of 2 positions shown; findings below may reference images not displayed]

FINDINGS: The heart size and mediastinal contours are within normal limits.
Both lungs are clear. The visualized skeletal structures are
unremarkable.
IMPRESSION: No active cardiopulmonary disease.

## 2018-11-25 ENCOUNTER — Other Ambulatory Visit: Payer: Self-pay

## 2018-11-25 DIAGNOSIS — Z20822 Contact with and (suspected) exposure to covid-19: Secondary | ICD-10-CM

## 2018-11-26 LAB — NOVEL CORONAVIRUS, NAA: SARS-CoV-2, NAA: NOT DETECTED

## 2018-12-03 ENCOUNTER — Telehealth: Payer: Self-pay | Admitting: Hematology

## 2018-12-03 NOTE — Telephone Encounter (Signed)
Pt is aware covid 19 test is neg  

## 2018-12-21 ENCOUNTER — Other Ambulatory Visit: Payer: Self-pay

## 2018-12-21 ENCOUNTER — Emergency Department (HOSPITAL_BASED_OUTPATIENT_CLINIC_OR_DEPARTMENT_OTHER)
Admission: EM | Admit: 2018-12-21 | Discharge: 2018-12-22 | Disposition: A | Discharge status: Home / Self Care | Payer: Self-pay | Attending: Emergency Medicine | Admitting: Emergency Medicine

## 2018-12-21 ENCOUNTER — Encounter (HOSPITAL_BASED_OUTPATIENT_CLINIC_OR_DEPARTMENT_OTHER): Payer: Self-pay

## 2018-12-21 DIAGNOSIS — R0789 Other chest pain: Secondary | ICD-10-CM | POA: Insufficient documentation

## 2018-12-21 DIAGNOSIS — Z79899 Other long term (current) drug therapy: Secondary | ICD-10-CM | POA: Insufficient documentation

## 2018-12-21 DIAGNOSIS — Z87891 Personal history of nicotine dependence: Secondary | ICD-10-CM | POA: Insufficient documentation

## 2018-12-21 DIAGNOSIS — Z733 Stress, not elsewhere classified: Secondary | ICD-10-CM | POA: Insufficient documentation

## 2018-12-21 DIAGNOSIS — Z885 Allergy status to narcotic agent status: Secondary | ICD-10-CM | POA: Insufficient documentation

## 2018-12-21 DIAGNOSIS — F4329 Adjustment disorder with other symptoms: Secondary | ICD-10-CM

## 2018-12-21 DIAGNOSIS — F329 Major depressive disorder, single episode, unspecified: Secondary | ICD-10-CM | POA: Insufficient documentation

## 2018-12-21 LAB — CBC
HCT: 40.3 % (ref 36.0–46.0)
Hemoglobin: 13.6 g/dL (ref 12.0–15.0)
MCH: 30 pg (ref 26.0–34.0)
MCHC: 33.7 g/dL (ref 30.0–36.0)
MCV: 89 fL (ref 80.0–100.0)
Platelets: 270 10*3/uL (ref 150–400)
RBC: 4.53 MIL/uL (ref 3.87–5.11)
RDW: 12.5 % (ref 11.5–15.5)
WBC: 6.8 10*3/uL (ref 4.0–10.5)
nRBC: 0 % (ref 0.0–0.2)

## 2018-12-21 LAB — BASIC METABOLIC PANEL WITH GFR
Anion gap: 10 (ref 5–15)
BUN: 15 mg/dL (ref 6–20)
CO2: 24 mmol/L (ref 22–32)
Calcium: 9.2 mg/dL (ref 8.9–10.3)
Chloride: 105 mmol/L (ref 98–111)
Creatinine, Ser: 0.99 mg/dL (ref 0.44–1.00)
GFR calc Af Amer: >60 mL/min
GFR calc non Af Amer: >60 mL/min
Glucose, Bld: 112 mg/dL — ABNORMAL HIGH (ref 70–99)
Potassium: 3.3 mmol/L — ABNORMAL LOW (ref 3.5–5.1)
Sodium: 139 mmol/L (ref 135–145)

## 2018-12-21 LAB — RAPID URINE DRUG SCREEN, HOSP PERFORMED
Amphetamines: NOT DETECTED
Barbiturates: NOT DETECTED
Benzodiazepines: NOT DETECTED
Cocaine: NOT DETECTED
Opiates: NOT DETECTED
Tetrahydrocannabinol: NOT DETECTED

## 2018-12-21 LAB — ETHANOL: Alcohol, Ethyl (B): 55 mg/dL — ABNORMAL HIGH
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
## 2018-12-21 NOTE — ED Notes (Signed)
Pt informed of delay from TTS

## 2018-12-21 NOTE — ED Triage Notes (Addendum)
Pt c/o HA and CP x today-denies fever and flu like sx-states she is tested at work for covid and was neg 11/6-pt states she feels her sx may be r/t to anxiety about work-NAD-steady gait

## 2018-12-21 NOTE — ED Notes (Signed)
ED Provider at bedside. 

## 2018-12-22 MED ORDER — ACETAMINOPHEN 325 MG PO TABS
650.0000 mg | ORAL_TABLET | Freq: Once | ORAL | Status: AC
  Administered 2018-12-22: 08:00:00 650 mg via ORAL
  Filled 2018-12-22: qty 2

## 2018-12-22 MED ORDER — HYDROXYZINE HCL 25 MG PO TABS: 25.0000 mg | ORAL_TABLET | Freq: Every evening | ORAL | 0 refills | Status: AC | PRN

## 2018-12-22 NOTE — ED Notes (Signed)
BH recommends observation over night and re-assess in morning.
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
## 2018-12-22 NOTE — ED Provider Notes (Signed)
Signout from Dr. Nicholes Stairs.  53 year old female here with some vague SI and feeling overwhelmed from stress at work.  She has been evaluated by behavioral health.  Plan is to reevaluate in the morning. Physical Exam  BP 124/80 (BP Location: Right Arm)   Pulse 67   Temp 98.7 F (37.1 C) (Oral)   Resp 18   Ht 5\' 7"  (1.702 m)   Wt 81.2 kg   SpO2 99%   BMI 28.04 kg/m   Physical Exam  ED Course/Procedures   Clinical Course as of Dec 22 930  [facility] Dec 21, 2018  2325 Labs reviewed.  No clinically significant abnormalities.   [JK]    Clinical Course User Index [JK] Dorie Rank, MD    Procedures  MDM  Consult from behavioral health at 3 AM states that she is okay for discharge.  Charge nurse is reaching out to behavioral health now to see if that is their formal recommendations with her still planning to reevaluate this morning.  Anticipate discharge soon.  TTS has confirmed that they have cleared the patient for discharge.  They made some recommendations for outpatient follow-up for the patient.     Hayden Rasmussen, MD 12/22/18 1024

## 2018-12-22 NOTE — ED Notes (Signed)
Spoke with [facility] staff and they confirmed pt is cleared for outpatient management.

## 2018-12-22 NOTE — ED Notes (Signed)
TTS Consult in progress with pt.

## 2019-07-04 ENCOUNTER — Emergency Department (HOSPITAL_BASED_OUTPATIENT_CLINIC_OR_DEPARTMENT_OTHER)
Admission: EM | Admit: 2019-07-04 | Discharge: 2019-07-04 | Disposition: A | Discharge status: Home / Self Care | Payer: Self-pay | Attending: Emergency Medicine | Admitting: Emergency Medicine

## 2019-07-04 ENCOUNTER — Encounter (HOSPITAL_BASED_OUTPATIENT_CLINIC_OR_DEPARTMENT_OTHER): Payer: Self-pay | Admitting: Emergency Medicine

## 2019-07-04 ENCOUNTER — Other Ambulatory Visit: Payer: Self-pay

## 2019-07-04 DIAGNOSIS — M62838 Other muscle spasm: Secondary | ICD-10-CM | POA: Insufficient documentation

## 2019-07-04 DIAGNOSIS — Z79899 Other long term (current) drug therapy: Secondary | ICD-10-CM | POA: Insufficient documentation

## 2019-07-04 MED ORDER — NAPROXEN 500 MG PO TABS: 500.0000 mg | ORAL_TABLET | Freq: Two times a day (BID) | ORAL | 0 refills | Status: AC

## 2019-07-04 MED ORDER — DIAZEPAM 5 MG/ML IJ SOLN
2.0000 mg | Freq: Once | INTRAMUSCULAR | Status: AC
  Administered 2019-07-04: 2 mg via INTRAMUSCULAR

## 2019-07-04 MED ORDER — DIAZEPAM 5 MG/ML IJ SOLN
2.0000 mg | Freq: Once | INTRAMUSCULAR | Status: DC
  Filled 2019-07-04: qty 2

## 2019-07-04 MED ORDER — METHOCARBAMOL 750 MG PO TABS: 750.0000 mg | ORAL_TABLET | Freq: Two times a day (BID) | ORAL | 0 refills | Status: AC | PRN

## 2019-07-04 MED ORDER — DIAZEPAM 5 MG PO TABS
5.0000 mg | ORAL_TABLET | Freq: Once | ORAL | Status: AC
  Administered 2019-07-04: 5 mg via ORAL
  Filled 2019-07-04: qty 1

## 2019-07-04 MED ORDER — FENTANYL CITRATE (PF) 100 MCG/2ML IJ SOLN
50.0000 ug | Freq: Once | INTRAMUSCULAR | Status: AC
  Administered 2019-07-04: 50 ug via INTRAMUSCULAR
  Filled 2019-07-04: qty 2

## 2019-07-04 MED ORDER — KETOROLAC TROMETHAMINE 30 MG/ML IJ SOLN
30.0000 mg | Freq: Once | INTRAMUSCULAR | Status: AC
  Administered 2019-07-04: 30 mg via INTRAMUSCULAR
  Filled 2019-07-04: qty 1

## 2019-07-04 NOTE — Discharge Instructions (Signed)

## 2019-07-04 NOTE — ED Provider Notes (Signed)
[facility] EMERGENCY DEPARTMENT Provider Note   CSN: [id number] Arrival date & time: 07/04/19  1105     History Chief Complaint  Patient presents with  . Neck Pain    Shannon Mccormick is a 54 y.o. female.  HPI    54 year old female presents to the emergency department today for evaluation of right-sided neck/trapezius pain.  Patient states that she was leaving for work prior to arrival and picked up her bag and put it on her right shoulder.  She sat the back down to complete another task and had pain to the right side of the neck and trapezius muscle.  Pain rated severe in nature and has been constant since onset.  It is worse when she tries to turn her head to the right.  She is also started to have somewhat of a headache since this started.  She denies any visual changes, lightheadedness, dizziness, nausea, vomiting, unilateral numbness/weakness.  She denies any chest pain or shortness of breath.  Denies any history of similar symptoms.  She denies any family history of aneurysm.  History reviewed. No pertinent past medical history.  There are no problems to display for this patient.   Past Surgical History:  Procedure Laterality Date  . CESAREAN SECTION    . ECTOPIC PREGNANCY SURGERY       OB History   No obstetric history on file.     History reviewed. No pertinent family history.  Social History   Tobacco Use  . Smoking status: Former Games developer  . Smokeless tobacco: Never Used  Substance Use Topics  . Alcohol use: Yes    Comment: occ  . Drug use: No    Home Medications Prior to Admission medications   Medication Sig Start Date End Date Taking? Authorizing Provider  diazepam (VALIUM) 5 MG tablet Take 0.5-1 tablets (2.5-5 mg total) by mouth every 6 (six) hours as needed for anxiety (spasms). 07/31/17   Mesner, Barbara Cower, MD  hydrOXYzine (ATARAX/VISTARIL) 25 MG tablet Take 1 tablet (25 mg total) by mouth at bedtime as needed for vomiting. 12/22/18   Terrilee Files, MD  meloxicam (MOBIC) 7.5 MG tablet Take 1 tablet (7.5 mg total) by mouth daily. 07/31/17   Mesner, Barbara Cower, MD  methocarbamol (ROBAXIN) 750 MG tablet Take 1 tablet (750 mg total) by mouth 2 (two) times daily as needed for up to 5 days for muscle spasms. 07/04/19 07/09/19  Chynah Orihuela S, PA-C  naproxen (NAPROSYN) 500 MG tablet Take 1 tablet (500 mg total) by mouth 2 (two) times daily for 7 days. 07/04/19 07/11/19  Mahir Prabhakar S, PA-C  oxyCODONE-acetaminophen (PERCOCET) 5-325 MG tablet Take 1-2 tablets by mouth every 8 (eight) hours as needed for severe pain. 07/31/17   Mesner, Barbara Cower, MD  traZODone (DESYREL) 50 MG tablet Take 0.5-1 tablets (25-50 mg total) by mouth at bedtime as needed for sleep. 03/12/18   Arthor Captain, PA-C    Allergies    Codeine  Review of Systems   Review of Systems  Constitutional: Negative for fever.  Eyes: Negative for visual disturbance.  Respiratory: Negative for shortness of breath.   Cardiovascular: Negative for chest pain.  Gastrointestinal: Negative for abdominal pain, nausea and vomiting.  Musculoskeletal: Positive for neck pain.  Neurological: Positive for headaches. Negative for dizziness, facial asymmetry, weakness, light-headedness and numbness.    Physical Exam Updated Vital Signs BP (!) 131/97 (BP Location: Right Arm)   Pulse 80   Temp 98.3 F (36.8 C) (Oral)  Resp 16   Ht 5\' 7"  (1.702 m)   Wt 82.6 kg   SpO2 98%   BMI 28.51 kg/m   Physical Exam Vitals and nursing note reviewed.  Constitutional:      General: She is not in acute distress.    Appearance: She is well-developed.  HENT:     Head: Normocephalic and atraumatic.  Eyes:     Conjunctiva/sclera: Conjunctivae normal.  Cardiovascular:     Rate and Rhythm: Normal rate.  Pulmonary:     Effort: Pulmonary effort is normal.  Musculoskeletal:        General: Normal range of motion.     Cervical back: Neck supple.     Comments: No midline cervical spine TTP. TTP noted  to the right cervical paraspinous muscles, right SCM, and right trapezius muscles. Also with TTP to the base of the scalp which reproduces pain.   Skin:    General: Skin is warm and dry.  Neurological:     Mental Status: She is alert.     Comments: Mental Status:  Alert, thought content appropriate, able to give a coherent history. Speech fluent without evidence of aphasia. Able to follow 2 step commands without difficulty.  Cranial Nerves:  II:  pupils equal, round, reactive to light III,IV, VI: ptosis not present, extra-ocular motions intact bilaterally  V,VII: smile symmetric, facial light touch sensation equal VIII: hearing grossly normal to voice  X: uvula elevates symmetrically  XI: bilateral shoulder shrug symmetric and strong XII: midline tongue extension without fassiculations Motor:  Normal tone. 5/5 strength of BUE and BLE major muscle groups including strong and equal grip strength and dorsiflexion/plantar flexion Sensory: light touch normal in all extremities.     ED Results / Procedures / Treatments   Labs (all labs ordered are listed, but only abnormal results are displayed) Labs Reviewed - No data to display  EKG None  Radiology No results found.  Procedures Procedures (including critical care time)  Medications Ordered in ED Medications  ketorolac (TORADOL) 30 MG/ML injection 30 mg (30 mg Intramuscular Given 07/04/19 1148)  diazepam (VALIUM) tablet 5 mg (5 mg Oral Given 07/04/19 1147)  fentaNYL (SUBLIMAZE) injection 50 mcg (50 mcg Intramuscular Given 07/04/19 1244)  diazepam (VALIUM) injection 2 mg (2 mg Intramuscular Given 07/04/19 1345)    ED Course  I have reviewed the triage vital signs and the nursing notes.  Pertinent labs & imaging results that were available during my care of the patient were reviewed by me and considered in my medical decision making (see chart for details).    MDM Rules/Calculators/A&P                      54 year old female  presenting for evaluation of right-sided neck pain starting prior to arrival after she lifted a bag.  Pain worse when turning her head to the right.  No associated neuro deficits.  Neuro exam is within normal limits.  Patient appears to have a muscle spasm on exam.  Will give pain medications and muscle relaxants and reassess.  Patient was given pain medications, muscle relaxers.  On reassessment she states she feels much improved and is now able to range her neck.  I suspect her symptoms are related to muscle spasm.  Will give Rx for anti-inflammatories, muscle relaxers for home.  Have advised return to ED for new or worsening symptoms meantime.  She voiced understanding of plan reasons to return.  All Questions answered.  Patient  stable for discharge.   Final Clinical Impression(s) / ED Diagnoses Final diagnoses:  Muscle spasms of neck    Rx / DC Orders ED Discharge Orders         Ordered    naproxen (NAPROSYN) 500 MG tablet  2 times daily     07/04/19 1412    methocarbamol (ROBAXIN) 750 MG tablet  2 times daily PRN     07/04/19 1412           Rhya Shan S, PA-C 07/04/19 1639    Lucrezia Starch, MD 07/07/19 1005

## 2019-07-04 NOTE — ED Triage Notes (Signed)
Pt c/o acute onset right side neck pain and posterior head pain. Pt denies injury, states was picking up a bag when pain occurred. Pt denies numbness or tingling.

## 2019-09-24 IMAGING — DX DG PELVIS 1-2V
1 series · 1 of 1 positions shown · non-contrast
Comparison: None.

CLINICAL DATA: Fall.  Right leg and groin pain.

EXAM:
PELVIS - 1-2 VIEW

[pelvis ap]
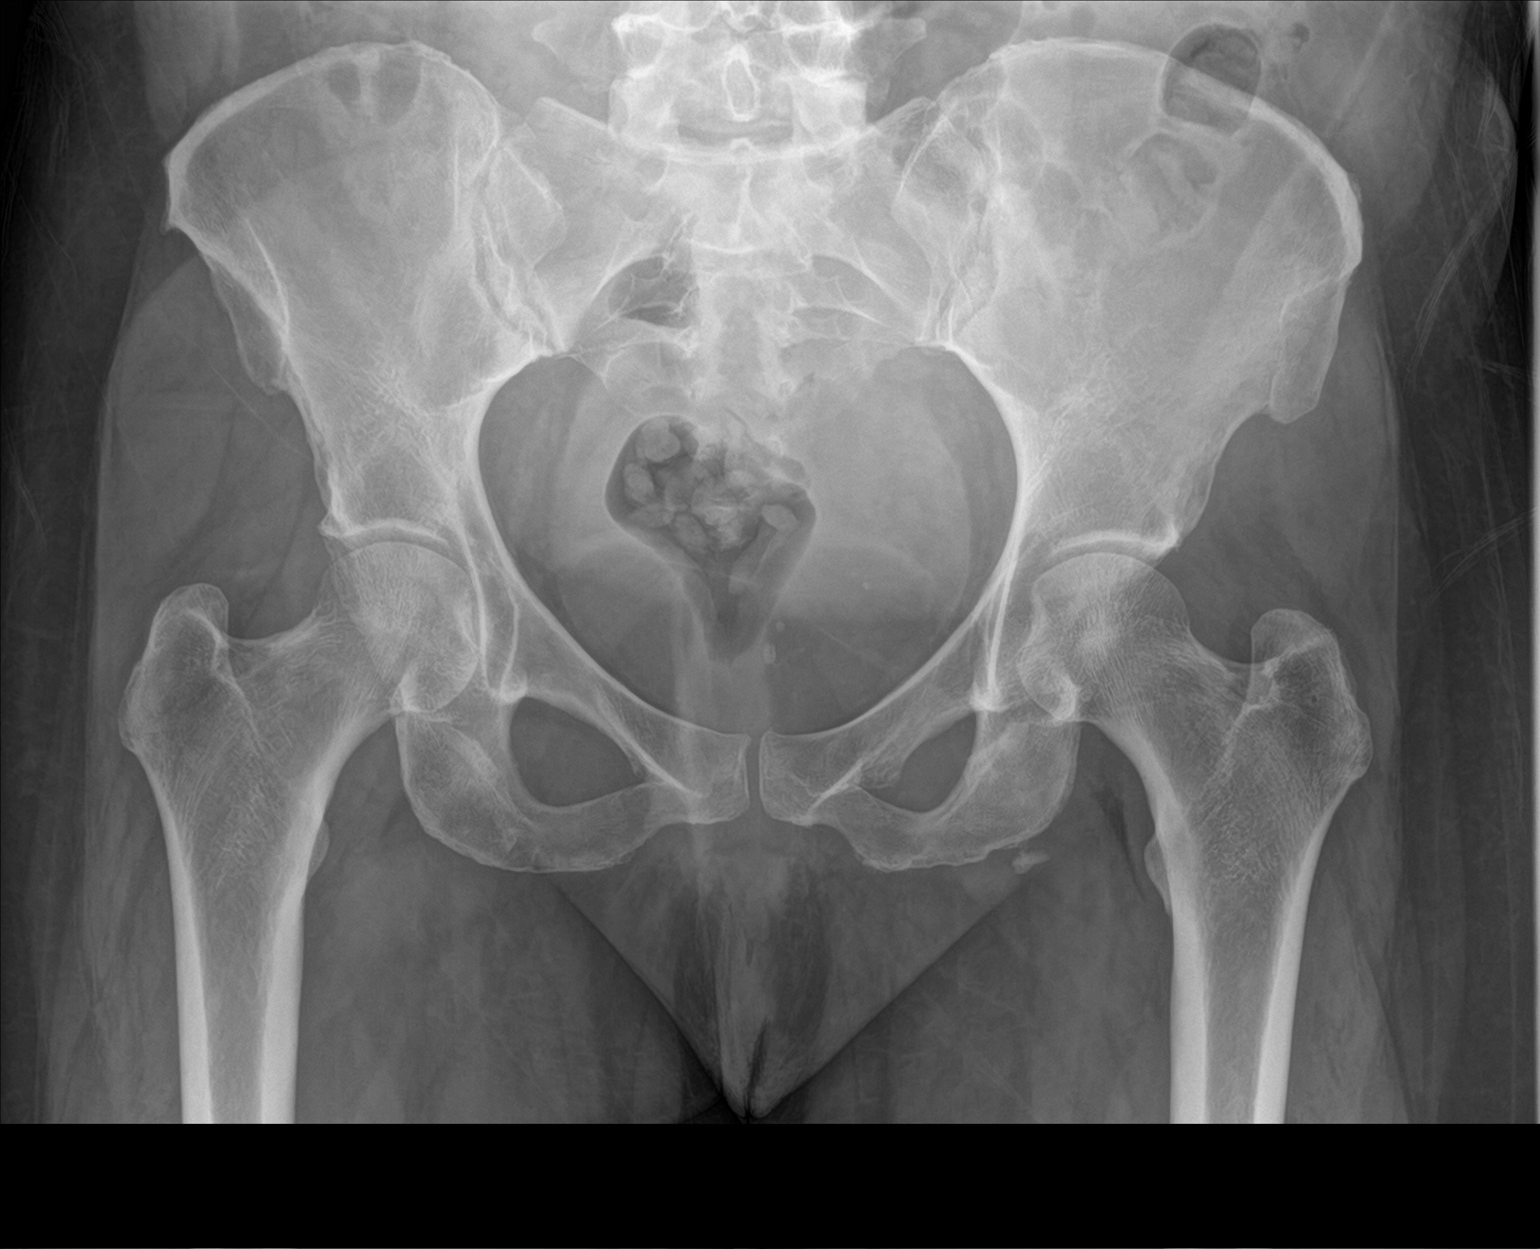

[1 of 1 positions shown; findings below may reference images not displayed]

FINDINGS: There is no evidence of pelvic fracture or diastasis. No pelvic bone
lesions are seen. Hip joints and SI joints are symmetric and
unremarkable.
IMPRESSION: Negative.
